# Patient Record
Sex: Female | Born: 1970 | Hispanic: No | Smoking: Never smoker
Health system: Southern US, Community
[De-identification: ages and names within clinical notes are randomized; demographics above are authoritative.]

## PROBLEM LIST (undated history)

## (undated) DIAGNOSIS — IMO0002 Reserved for concepts with insufficient information to code with codable children: Secondary | ICD-10-CM

## (undated) DIAGNOSIS — F419 Anxiety disorder, unspecified: Secondary | ICD-10-CM

## (undated) DIAGNOSIS — A64 Unspecified sexually transmitted disease: Secondary | ICD-10-CM

## (undated) DIAGNOSIS — K589 Irritable bowel syndrome without diarrhea: Secondary | ICD-10-CM

## (undated) HISTORY — DX: Reserved for concepts with insufficient information to code with codable children: IMO0002

## (undated) HISTORY — DX: Anxiety disorder, unspecified: F41.9

## (undated) HISTORY — PX: TONSILLECTOMY AND ADENOIDECTOMY: SUR1326

## (undated) HISTORY — DX: Irritable bowel syndrome, unspecified: K58.9

## (undated) HISTORY — DX: Unspecified sexually transmitted disease: A64

---
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
## 1988-06-16 DIAGNOSIS — A64 Unspecified sexually transmitted disease: Secondary | ICD-10-CM

## 1988-06-16 HISTORY — DX: Unspecified sexually transmitted disease: A64

## 2007-04-22 HISTORY — PX: INTRAUTERINE DEVICE (IUD) INSERTION: SHX5877

## 2007-08-15 HISTORY — PX: CERVICAL BIOPSY  W/ LOOP ELECTRODE EXCISION: SUR135

## 2008-05-26 ENCOUNTER — Other Ambulatory Visit: Admission: RE | Admit: 2008-05-26 | Discharge: 2008-05-26 | Payer: Self-pay | Admitting: Obstetrics and Gynecology

## 2008-12-08 DIAGNOSIS — Z800 Family history of malignant neoplasm of digestive organs: Secondary | ICD-10-CM | POA: Insufficient documentation

## 2009-07-30 HISTORY — PX: COLPOSCOPY: SHX161

## 2009-10-29 DIAGNOSIS — IMO0002 Reserved for concepts with insufficient information to code with codable children: Secondary | ICD-10-CM

## 2009-10-29 DIAGNOSIS — R87619 Unspecified abnormal cytological findings in specimens from cervix uteri: Secondary | ICD-10-CM

## 2009-10-29 HISTORY — DX: Unspecified abnormal cytological findings in specimens from cervix uteri: R87.619

## 2009-10-29 HISTORY — DX: Reserved for concepts with insufficient information to code with codable children: IMO0002

## 2010-01-28 ENCOUNTER — Ambulatory Visit (HOSPITAL_COMMUNITY): Admission: RE | Admit: 2010-01-28 | Discharge: 2010-01-28 | Payer: Self-pay | Admitting: Obstetrics and Gynecology

## 2010-01-28 ENCOUNTER — Encounter: Payer: Self-pay | Admitting: Obstetrics and Gynecology

## 2010-01-28 HISTORY — PX: VAGINAL HYSTERECTOMY: SUR661

## 2010-07-22 ENCOUNTER — Other Ambulatory Visit: Payer: Self-pay | Admitting: Obstetrics and Gynecology

## 2010-07-22 DIAGNOSIS — Z1231 Encounter for screening mammogram for malignant neoplasm of breast: Secondary | ICD-10-CM

## 2010-08-01 ENCOUNTER — Ambulatory Visit: Payer: Self-pay

## 2010-08-30 LAB — CBC
HCT: 34.8 % — ABNORMAL LOW (ref 36.0–46.0)
MCH: 30.4 pg (ref 26.0–34.0)
MCV: 88.5 fL (ref 78.0–100.0)
Platelets: 222 10*3/uL (ref 150–400)
RBC: 3.94 MIL/uL (ref 3.87–5.11)
RDW: 13.7 % (ref 11.5–15.5)

## 2012-12-29 ENCOUNTER — Encounter: Payer: Self-pay | Admitting: Obstetrics and Gynecology

## 2012-12-31 ENCOUNTER — Ambulatory Visit (INDEPENDENT_AMBULATORY_CARE_PROVIDER_SITE_OTHER): Payer: BC Managed Care – PPO | Admitting: Obstetrics and Gynecology

## 2012-12-31 ENCOUNTER — Encounter: Payer: Self-pay | Admitting: Obstetrics and Gynecology

## 2012-12-31 VITALS — BP 98/60 | HR 80 | Resp 20 | Ht 68.0 in | Wt 185.0 lb

## 2012-12-31 DIAGNOSIS — Z23 Encounter for immunization: Secondary | ICD-10-CM

## 2012-12-31 DIAGNOSIS — Z01419 Encounter for gynecological examination (general) (routine) without abnormal findings: Secondary | ICD-10-CM

## 2012-12-31 MED ORDER — SERTRALINE HCL 50 MG PO TABS: 100.0000 mg | ORAL_TABLET | Freq: Every day | ORAL | Status: DC
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
## 2012-12-31 NOTE — Patient Instructions (Signed)
EXERCISE AND DIET:  We recommended that you start or continue a regular exercise program for good health. Regular exercise means any activity that makes your heart beat faster and makes you sweat.  We recommend exercising at least 30 minutes per day at least 3 days a week, preferably 4 or 5.  We also recommend a diet low in fat and sugar.  Inactivity, poor dietary choices and obesity can cause diabetes, heart attack, stroke, and kidney damage, among others.    ALCOHOL AND SMOKING:  Women should limit their alcohol intake to no more than 7 drinks/beers/glasses of wine (combined, not each!) per week. Moderation of alcohol intake to this level decreases your risk of breast cancer and liver damage. And of course, no recreational drugs are part of a healthy lifestyle.  And absolutely no smoking or even second hand smoke. Most people know smoking can cause heart and lung diseases, but did you know it also contributes to weakening of your bones? Aging of your skin?  Yellowing of your teeth and nails?  CALCIUM AND VITAMIN D:  Adequate intake of calcium and Vitamin D are recommended.  The recommendations for exact amounts of these supplements seem to change often, but generally speaking 600 mg of calcium (either carbonate or citrate) and 800 units of Vitamin D per day seems prudent. Certain women may benefit from higher intake of Vitamin D.  If you are among these women, your doctor will have told you during your visit.    PAP SMEARS:  Pap smears, to check for cervical cancer or precancers,  have traditionally been done yearly, although recent scientific advances have shown that most women can have pap smears less often.  However, every woman still should have a physical exam from her gynecologist every year. It will include a breast check, inspection of the vulva and vagina to check for abnormal growths or skin changes, a visual exam of the cervix, and then an exam to evaluate the size and shape of the uterus and  ovaries.  And after 44 years of age, a rectal exam is indicated to check for rectal cancers. We will also provide age appropriate advice regarding health maintenance, like when you should have certain vaccines, screening for sexually transmitted diseases, bone density testing, colonoscopy, mammograms, etc.   MAMMOGRAMS:  All women over 40 years old should have a yearly mammogram. Many facilities now offer a "3D" mammogram, which may cost around $50 extra out of pocket. If possible,  we recommend you accept the option to have the 3D mammogram performed.  It both reduces the number of women who will be called back for extra views which then turn out to be normal, and it is better than the routine mammogram at detecting truly abnormal areas.       

## 2013-01-05 LAB — IPS PAP TEST WITH HPV

## 2013-01-27 ENCOUNTER — Other Ambulatory Visit: Payer: Self-pay | Admitting: Obstetrics and Gynecology

## 2013-01-27 NOTE — Telephone Encounter (Signed)
Refills x 1 year sent on 12/31/12.  RX denied.

## 2013-01-28 ENCOUNTER — Other Ambulatory Visit: Payer: Self-pay | Admitting: *Deleted

## 2013-01-28 MED ORDER — SERTRALINE HCL 100 MG PO TABS: 100.0000 mg | ORAL_TABLET | Freq: Every day | ORAL | Status: DC

## 2013-01-28 NOTE — Telephone Encounter (Signed)
Pt previously given 50 mg tab to take 2 daily to equal 100 mg.  Pt and pharmacy requesting 100 mg tab to take 1 tab daily.  RX sent.

## 2013-02-18 ENCOUNTER — Telehealth: Payer: Self-pay | Admitting: Obstetrics and Gynecology

## 2013-02-18 NOTE — Telephone Encounter (Signed)
9/5 lmtcb//kn

## 2013-02-18 NOTE — Telephone Encounter (Signed)
Patient needs refill on Fioricet 40 mgs. ESGIC  [facility]  [place]

## 2013-03-02 NOTE — Telephone Encounter (Signed)
S/w patient she apologized for not calling back she gets rx from [facility] not Dr. Tresa Res.

## 2014-01-09 ENCOUNTER — Ambulatory Visit: Payer: BC Managed Care – PPO | Admitting: Nurse Practitioner

## 2014-01-30 ENCOUNTER — Other Ambulatory Visit: Payer: Self-pay | Admitting: *Deleted

## 2014-01-30 MED ORDER — SERTRALINE HCL 100 MG PO TABS: 100.0000 mg | ORAL_TABLET | Freq: Every day | ORAL | Status: DC

## 2014-01-30 NOTE — Telephone Encounter (Signed)
Fax From: [facility] for Zoloft 100 mg  Last Refilled: 01/28/13 #30/11 refills  Aex Scheduled: 03/10/14 with Ms. Patty  Okay to refill until AEX?

## 2014-01-30 NOTE — Telephone Encounter (Signed)
Faxed fax stating that rx has been sent electronically. 

## 2014-03-10 ENCOUNTER — Ambulatory Visit: Payer: BC Managed Care – PPO | Admitting: Nurse Practitioner

## 2014-04-14 ENCOUNTER — Ambulatory Visit (INDEPENDENT_AMBULATORY_CARE_PROVIDER_SITE_OTHER): Payer: BC Managed Care – PPO | Admitting: Nurse Practitioner

## 2014-04-14 ENCOUNTER — Encounter: Payer: Self-pay | Admitting: Nurse Practitioner

## 2014-04-14 VITALS — BP 110/72 | HR 84 | Ht 68.0 in | Wt 191.0 lb

## 2014-04-14 DIAGNOSIS — Z1211 Encounter for screening for malignant neoplasm of colon: Secondary | ICD-10-CM

## 2014-04-14 DIAGNOSIS — Z0000 Encounter for general adult medical examination without abnormal findings: Secondary | ICD-10-CM

## 2014-04-14 DIAGNOSIS — Z01419 Encounter for gynecological examination (general) (routine) without abnormal findings: Secondary | ICD-10-CM

## 2014-04-14 DIAGNOSIS — R319 Hematuria, unspecified: Secondary | ICD-10-CM

## 2014-04-14 LAB — POCT URINALYSIS DIPSTICK
BILIRUBIN UA: NEGATIVE
Glucose, UA: NEGATIVE
KETONES UA: NEGATIVE
Leukocytes, UA: NEGATIVE
NITRITE UA: NEGATIVE
PH UA: 5
Protein, UA: NEGATIVE
Urobilinogen, UA: NEGATIVE

## 2014-04-14 LAB — HEMOGLOBIN, FINGERSTICK: HEMOGLOBIN, FINGERSTICK: 11.7 g/dL — AB (ref 12.0–16.0)

## 2014-04-14 MED ORDER — SERTRALINE HCL 100 MG PO TABS: 100.0000 mg | ORAL_TABLET | Freq: Every day | ORAL | Status: DC
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
## 2014-04-14 NOTE — Patient Instructions (Signed)

## 2014-04-15 LAB — URINALYSIS, MICROSCOPIC ONLY
Bacteria, UA: NONE SEEN
Casts: NONE SEEN
Crystals: NONE SEEN
Squamous Epithelial / HPF: NONE SEEN

## 2014-04-16 LAB — URINE CULTURE
Colony Count: NO GROWTH
ORGANISM ID, BACTERIA: NO GROWTH

## 2014-04-16 NOTE — Progress Notes (Signed)
Encounter reviewed by Dr. Conley SimmondsBrook Silva. Urine micro and culture sent due to microscopic hematuria.

## 2014-04-17 ENCOUNTER — Encounter: Payer: Self-pay | Admitting: Nurse Practitioner

## 2014-04-19 ENCOUNTER — Telehealth: Payer: Self-pay | Admitting: *Deleted

## 2014-04-19 LAB — IPS PAP TEST WITH HPV

## 2014-04-19 NOTE — Telephone Encounter (Signed)
Called pt to ask abut IFOB kit to see if Ms Chong Sicilian gave her one.  Patient called back and say that Ms Chong Sicilian did give her one.   - Tracy Weber doing IFOB Charges.

## 2014-06-14 LAB — FECAL OCCULT BLOOD, IMMUNOCHEMICAL: Fecal Occult Blood: NEGATIVE

## 2014-09-28 ENCOUNTER — Telehealth: Payer: Self-pay | Admitting: Nurse Practitioner

## 2014-09-28 NOTE — Telephone Encounter (Signed)
Left patient a message to call back to reschedule a future appointment that was cancelled by the provider. °

## 2014-11-07 DIAGNOSIS — D539 Nutritional anemia, unspecified: Secondary | ICD-10-CM | POA: Insufficient documentation

## 2014-11-08 DIAGNOSIS — E782 Mixed hyperlipidemia: Secondary | ICD-10-CM | POA: Insufficient documentation

## 2014-12-13 DIAGNOSIS — K222 Esophageal obstruction: Secondary | ICD-10-CM | POA: Insufficient documentation

## 2015-04-19 ENCOUNTER — Ambulatory Visit: Payer: Self-pay | Admitting: Nurse Practitioner

## 2015-05-08 ENCOUNTER — Other Ambulatory Visit: Payer: Self-pay | Admitting: Nurse Practitioner

## 2015-05-08 NOTE — Telephone Encounter (Signed)
Needs AEX 

## 2015-05-08 NOTE — Telephone Encounter (Signed)
Medication refill request: Zoloft 100 mg Last AEX: 04/14/2014 PG Next AEX: None Last MMG (if hormonal medication request): 10/25/2012 BiRADS 2 Refill authorized: Zoloft #90 tabs 3 Refills Today: #90 tabs 3 Refills ?

## 2015-08-17 ENCOUNTER — Other Ambulatory Visit: Payer: Self-pay | Admitting: Nurse Practitioner

## 2015-08-17 NOTE — Telephone Encounter (Signed)
Medication refill request: zoloft  Last AEX:  04/14/14  Next AEX: none Last MMG (if hormonal medication request): 10/2012 BIRADS2:Benign  Refill authorized: needs AEX . Per Mrs Patty's last refill note

## 2015-11-27 ENCOUNTER — Encounter: Payer: Self-pay | Admitting: Nurse Practitioner

## 2015-11-27 ENCOUNTER — Ambulatory Visit (INDEPENDENT_AMBULATORY_CARE_PROVIDER_SITE_OTHER): Payer: BLUE CROSS/BLUE SHIELD | Admitting: Nurse Practitioner

## 2015-11-27 VITALS — BP 100/66 | HR 68 | Resp 18 | Ht 68.25 in | Wt 192.0 lb

## 2015-11-27 DIAGNOSIS — Z0000 Encounter for general adult medical examination without abnormal findings: Secondary | ICD-10-CM

## 2015-11-27 DIAGNOSIS — Z01419 Encounter for gynecological examination (general) (routine) without abnormal findings: Secondary | ICD-10-CM | POA: Diagnosis not present

## 2015-11-27 LAB — HIV ANTIBODY (ROUTINE TESTING W REFLEX): HIV: NONREACTIVE
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
## 2015-11-27 NOTE — Patient Instructions (Signed)

## 2015-11-29 LAB — IPS PAP TEST WITH HPV

## 2015-11-30 NOTE — Progress Notes (Signed)
Encounter reviewed by Dr. Ardyth Kelso Amundson C. Silva.  

## 2015-12-10 ENCOUNTER — Other Ambulatory Visit: Payer: Self-pay | Admitting: Nurse Practitioner

## 2015-12-10 MED ORDER — SERTRALINE HCL 100 MG PO TABS: ORAL_TABLET | ORAL | Status: DC

## 2015-12-10 NOTE — Telephone Encounter (Signed)
Medication refill request: Sertraline 100mg  Last AEX:  11/27/15 PG Next AEX: 10/28/16 Last MMG (if hormonal medication request): 08/02/15 BIRADS2 Refill authorized: Sertraline 100 mg #30 0R. Please advise. Thank you.

## 2015-12-10 NOTE — Telephone Encounter (Signed)
Patient is asking for a refill of sertraline (ZOLOFT) 100 MG tablet. Confirmed pharmacy with patient.

## 2016-11-15 DIAGNOSIS — E7800 Pure hypercholesterolemia, unspecified: Secondary | ICD-10-CM | POA: Insufficient documentation

## 2016-11-28 ENCOUNTER — Ambulatory Visit: Payer: BLUE CROSS/BLUE SHIELD | Admitting: Nurse Practitioner

## 2016-12-08 ENCOUNTER — Other Ambulatory Visit: Payer: Self-pay | Admitting: Nurse Practitioner

## 2016-12-08 NOTE — Telephone Encounter (Signed)
Medication refill request: Zoloft  Last AEX:  11-27-15  Next AEX: 12-30-16  Last MMG (if hormonal medication request): 08-02-15 WNL  Refill authorized: please advise

## 2016-12-15 ENCOUNTER — Telehealth: Payer: Self-pay | Admitting: Nurse Practitioner

## 2016-12-15 NOTE — Telephone Encounter (Signed)
Call to patient about cx appt/ Patient is in aex recall/RD °

## 2016-12-30 ENCOUNTER — Ambulatory Visit: Payer: BLUE CROSS/BLUE SHIELD | Admitting: Nurse Practitioner

## 2017-01-08 ENCOUNTER — Encounter: Payer: Self-pay | Admitting: Certified Nurse Midwife

## 2017-01-08 ENCOUNTER — Other Ambulatory Visit (HOSPITAL_COMMUNITY)
Admission: RE | Admit: 2017-01-08 | Discharge: 2017-01-08 | Disposition: A | Discharge status: Home / Self Care | Payer: BLUE CROSS/BLUE SHIELD | Source: Ambulatory Visit | Attending: Obstetrics & Gynecology | Admitting: Obstetrics & Gynecology

## 2017-01-08 ENCOUNTER — Ambulatory Visit (INDEPENDENT_AMBULATORY_CARE_PROVIDER_SITE_OTHER): Payer: BLUE CROSS/BLUE SHIELD | Admitting: Certified Nurse Midwife

## 2017-01-08 VITALS — BP 110/62 | HR 64 | Resp 16 | Ht 68.5 in | Wt 185.0 lb

## 2017-01-08 DIAGNOSIS — F329 Major depressive disorder, single episode, unspecified: Secondary | ICD-10-CM

## 2017-01-08 DIAGNOSIS — Z01419 Encounter for gynecological examination (general) (routine) without abnormal findings: Secondary | ICD-10-CM | POA: Insufficient documentation

## 2017-01-08 DIAGNOSIS — F419 Anxiety disorder, unspecified: Secondary | ICD-10-CM | POA: Diagnosis not present

## 2017-01-08 DIAGNOSIS — Z124 Encounter for screening for malignant neoplasm of cervix: Secondary | ICD-10-CM | POA: Diagnosis not present

## 2017-01-08 DIAGNOSIS — F32A Depression, unspecified: Secondary | ICD-10-CM

## 2017-01-08 MED ORDER — SERTRALINE HCL 100 MG PO TABS: 100.0000 mg | ORAL_TABLET | Freq: Every day | ORAL | 3 refills | Status: DC
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
## 2017-01-08 NOTE — Patient Instructions (Signed)

## 2017-01-08 NOTE — Addendum Note (Signed)
Addended by: Verner CholLEONARD, Earlie Schank S on: 01/08/2017 03:35 PM   Modules accepted: Orders

## 2017-01-12 LAB — CYTOLOGY - PAP: Diagnosis: NEGATIVE

## 2018-01-13 ENCOUNTER — Other Ambulatory Visit (HOSPITAL_COMMUNITY)
Admission: RE | Admit: 2018-01-13 | Discharge: 2018-01-13 | Disposition: A | Discharge status: Home / Self Care | Payer: BLUE CROSS/BLUE SHIELD | Source: Ambulatory Visit | Attending: Obstetrics & Gynecology | Admitting: Obstetrics & Gynecology

## 2018-01-13 ENCOUNTER — Encounter: Payer: Self-pay | Admitting: Certified Nurse Midwife

## 2018-01-13 ENCOUNTER — Ambulatory Visit: Payer: BLUE CROSS/BLUE SHIELD | Admitting: Certified Nurse Midwife

## 2018-01-13 VITALS — BP 110/78 | HR 68 | Resp 16 | Ht 67.75 in | Wt 178.0 lb

## 2018-01-13 DIAGNOSIS — Z8742 Personal history of other diseases of the female genital tract: Secondary | ICD-10-CM

## 2018-01-13 DIAGNOSIS — Z124 Encounter for screening for malignant neoplasm of cervix: Secondary | ICD-10-CM | POA: Diagnosis not present

## 2018-01-13 DIAGNOSIS — Z01419 Encounter for gynecological examination (general) (routine) without abnormal findings: Secondary | ICD-10-CM | POA: Diagnosis not present

## 2018-01-13 DIAGNOSIS — Z87898 Personal history of other specified conditions: Secondary | ICD-10-CM | POA: Diagnosis not present

## 2018-01-13 NOTE — Patient Instructions (Signed)
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
## 2018-01-15 LAB — CYTOLOGY - PAP: Diagnosis: NEGATIVE

## 2018-03-20 ENCOUNTER — Other Ambulatory Visit: Payer: Self-pay | Admitting: Certified Nurse Midwife

## 2018-03-20 DIAGNOSIS — F419 Anxiety disorder, unspecified: Principal | ICD-10-CM

## 2018-03-20 DIAGNOSIS — F329 Major depressive disorder, single episode, unspecified: Secondary | ICD-10-CM

## 2018-03-22 NOTE — Telephone Encounter (Signed)
Medication refill request: Zoloft  Last AEX:  01-13-18 DL  Next AEX: 1-61- 20  Last MMG (if hormonal medication request): 11-12-17 BIRADS 2 benign  Refill authorized: 01-08-17 #90, 3RF. Today, please advise.

## 2019-01-07 ENCOUNTER — Other Ambulatory Visit: Payer: Self-pay

## 2019-01-11 ENCOUNTER — Ambulatory Visit: Payer: BC Managed Care – PPO | Admitting: Certified Nurse Midwife

## 2019-01-11 ENCOUNTER — Other Ambulatory Visit (HOSPITAL_COMMUNITY)
Admission: RE | Admit: 2019-01-11 | Discharge: 2019-01-11 | Disposition: A | Discharge status: Home / Self Care | Payer: BC Managed Care – PPO | Source: Ambulatory Visit | Attending: Certified Nurse Midwife | Admitting: Certified Nurse Midwife

## 2019-01-11 ENCOUNTER — Other Ambulatory Visit: Payer: Self-pay

## 2019-01-11 ENCOUNTER — Encounter: Payer: Self-pay | Admitting: Certified Nurse Midwife

## 2019-01-11 VITALS — BP 110/64 | HR 68 | Temp 97.2°F | Resp 16 | Ht 67.75 in | Wt 168.0 lb

## 2019-01-11 DIAGNOSIS — Z01419 Encounter for gynecological examination (general) (routine) without abnormal findings: Secondary | ICD-10-CM | POA: Diagnosis not present

## 2019-01-11 DIAGNOSIS — Z124 Encounter for screening for malignant neoplasm of cervix: Secondary | ICD-10-CM | POA: Insufficient documentation
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
## 2019-01-13 LAB — CYTOLOGY - PAP
Diagnosis: NEGATIVE
HPV: NOT DETECTED

## 2019-03-26 ENCOUNTER — Other Ambulatory Visit: Payer: Self-pay | Admitting: Certified Nurse Midwife

## 2019-03-26 DIAGNOSIS — F419 Anxiety disorder, unspecified: Secondary | ICD-10-CM

## 2019-03-26 DIAGNOSIS — F329 Major depressive disorder, single episode, unspecified: Secondary | ICD-10-CM

## 2019-03-28 NOTE — Telephone Encounter (Signed)
Medication refill request: Zoloft Last AEX:  01/11/2019 DL Next AEX: 01/12/3030 Last MMG (if hormonal medication request): 11/12/2017 BIRADS 2 Negative Refill authorized: Pending #90 with 2 refills if appropriate. Please advise.

## 2019-09-05 ENCOUNTER — Encounter: Payer: Self-pay | Admitting: Certified Nurse Midwife

## 2020-01-13 ENCOUNTER — Ambulatory Visit: Payer: BC Managed Care – PPO | Admitting: Certified Nurse Midwife
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
## 2020-02-23 ENCOUNTER — Ambulatory Visit: Payer: BC Managed Care – PPO | Admitting: Obstetrics and Gynecology

## 2020-03-12 ENCOUNTER — Ambulatory Visit: Payer: BC Managed Care – PPO | Admitting: Obstetrics and Gynecology

## 2020-03-29 ENCOUNTER — Telehealth: Payer: Self-pay

## 2020-03-29 DIAGNOSIS — F32A Depression, unspecified: Secondary | ICD-10-CM

## 2020-03-29 DIAGNOSIS — F419 Anxiety disorder, unspecified: Secondary | ICD-10-CM

## 2020-03-29 MED ORDER — SERTRALINE HCL 100 MG PO TABS: 100.0000 mg | ORAL_TABLET | Freq: Every day | ORAL | 0 refills | Status: DC

## 2020-03-29 NOTE — Telephone Encounter (Signed)
Refill sent. She is over due for an annual exam, see if you can get her in with Rutherford Guys, NP sooner (if patient is okay with that plan)

## 2020-03-29 NOTE — Telephone Encounter (Signed)
Medication refill request: Sertraline 100mg   Last AEX:  01/11/19 Next AEX: 09/06/20 Last MMG (if hormonal medication request): 11/12/17  Neg  Refill authorized: 90/0

## 2020-04-12 NOTE — Telephone Encounter (Signed)
Left message for pt to return call to triage RN. Next AEX 08/2020 Last AEX 12/2018 with DL

## 2020-04-12 NOTE — Telephone Encounter (Signed)
Patient returned call. She is scheduled for aex 04/30/20 with Tresa Endo.
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
## 2020-04-30 ENCOUNTER — Other Ambulatory Visit: Payer: Self-pay

## 2020-04-30 ENCOUNTER — Ambulatory Visit: Payer: BC Managed Care – PPO | Admitting: Nurse Practitioner

## 2020-04-30 ENCOUNTER — Encounter: Payer: Self-pay | Admitting: Nurse Practitioner

## 2020-04-30 VITALS — BP 104/74 | HR 68 | Resp 16 | Ht 67.75 in | Wt 177.0 lb

## 2020-04-30 DIAGNOSIS — K644 Residual hemorrhoidal skin tags: Secondary | ICD-10-CM | POA: Diagnosis not present

## 2020-04-30 DIAGNOSIS — Z01419 Encounter for gynecological examination (general) (routine) without abnormal findings: Secondary | ICD-10-CM | POA: Diagnosis not present

## 2020-04-30 NOTE — Patient Instructions (Signed)
Nice to meet you today!

## 2020-06-11 ENCOUNTER — Telehealth: Payer: Self-pay

## 2020-06-11 DIAGNOSIS — K644 Residual hemorrhoidal skin tags: Secondary | ICD-10-CM

## 2020-06-11 NOTE — Telephone Encounter (Signed)
Spoke with patient. Patient request to proceed with removal of skin lesion discussed at AEX on 04/30/20.  Procedure scheduled for 12/29 at 1:30pm with Clarita Crane, NP.  Patient verbalizes understanding and is agreeable.   Tresa Endo -please confirm size of lesion for precert.   Cc: Hayley Carder

## 2020-06-11 NOTE — Telephone Encounter (Signed)
Patient is calling in regards to needing a skin tag removal.

## 2020-06-11 NOTE — Telephone Encounter (Signed)
Reviewed with Tracy Crane, NP.  Skin lesion approximately 33mm in size.  Order placed for precert.   Routing to [employer].  Encounter Closed.

## 2020-06-12 ENCOUNTER — Telehealth: Payer: Self-pay

## 2020-06-12 NOTE — Telephone Encounter (Signed)
Call to patient. Per DPR, OK to leave message on voicemail.   Left voicemail requesting a return call to [facility] to review benefits for scheduled Skin lesion removal with Clarita Crane, NP.
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
## 2020-06-13 ENCOUNTER — Other Ambulatory Visit: Payer: Self-pay

## 2020-06-13 ENCOUNTER — Encounter: Payer: Self-pay | Admitting: Nurse Practitioner

## 2020-06-13 ENCOUNTER — Other Ambulatory Visit (HOSPITAL_COMMUNITY)
Admission: RE | Admit: 2020-06-13 | Discharge: 2020-06-13 | Disposition: A | Discharge status: Home / Self Care | Payer: BC Managed Care – PPO | Source: Ambulatory Visit | Attending: Nurse Practitioner | Admitting: Nurse Practitioner

## 2020-06-13 ENCOUNTER — Ambulatory Visit (INDEPENDENT_AMBULATORY_CARE_PROVIDER_SITE_OTHER): Payer: BC Managed Care – PPO | Admitting: Nurse Practitioner

## 2020-06-13 VITALS — BP 114/64 | HR 68 | Wt 182.0 lb

## 2020-06-13 DIAGNOSIS — L989 Disorder of the skin and subcutaneous tissue, unspecified: Secondary | ICD-10-CM | POA: Insufficient documentation

## 2020-06-13 DIAGNOSIS — K644 Residual hemorrhoidal skin tags: Secondary | ICD-10-CM | POA: Diagnosis not present

## 2020-06-13 NOTE — Addendum Note (Signed)
Addended by: Timoteo Expose on: 06/13/2020 05:04 PM   Modules accepted: Orders

## 2020-06-13 NOTE — Patient Instructions (Signed)
Keep clean and dry . Remove bandage after 24 hours, keep covered with band-aid until well scabbed. Please contact office if any signs of infection (redness, pain, drainage, odor) If bleeding that saturates the gauze occurs, apply pressure, add additional gauze over existing dressing and call clinic. Your biopsy results should be back in 1 week.   Excision of Skin Lesions, Care After This sheet gives you information about how to care for yourself after your procedure. Your health care provider may also give you more specific instructions. If you have problems or questions, contact your health care provider. What can I expect after the procedure? After your procedure, it is common to have pain or discomfort at the excision site. Follow these instructions at home: Excision care   Follow instructions from your health care provider about how to take care of your excision site. Make sure you: ? Wash your hands with soap and water before and after you change your bandage (dressing). If soap and water are not available, use hand sanitizer. ? Change your dressing as told by your health care provider. ? Leave stitches (sutures), skin glue, or adhesive strips in place. These skin closures may need to stay in place for 2 weeks or longer. If adhesive strip edges start to loosen and curl up, you may trim the loose edges. Do not remove adhesive strips completely unless your health care provider tells you to do that.  Check the excision area every day for signs of infection. Watch for: ? Redness, swelling, or pain. ? Fluid or blood. ? Warmth. ? Pus or a bad smell.  Keep the site clean, dry, and protected for at least 48 hours.  For bleeding, apply gentle but firm pressure to the area using a folded towel for 20 minutes.  Avoid high-impact exercise and activities until the sutures are removed or the area heals. General instructions  Take over-the-counter and prescription medicines only as told by your  health care provider.  Follow instructions from your health care provider about how to minimize scarring. Scarring should lessen over time.  Avoid sun exposure until the area has healed. Use sunscreen to protect the area from the sun after it has healed.  Keep all follow-up visits as told by your health care provider. This is important. Contact a health care provider if:  You have redness, swelling, or pain around your excision site.  You have fluid or blood coming from your excision site.  Your excision site feels warm to the touch.  You have pus or a bad smell coming from your excision site.  You have a fever.  You have pain that does not improve in 2-3 days after your procedure.  You notice skin irregularities or changes in how you feel (sensation). Summary  This sheet of instructions provides you with information about caring for yourself after your procedure. Contact your health care provider if you have any problems or questions.  Take over-the-counter and prescription medicines only as told by your health care provider.  Change your dressing as told by your health care provider.  Contact a health care provider if you have redness, swelling, pain, or other signs of infection around your excision site.  Keep all follow-up visits as told by your health care provider. This is important. This information is not intended to replace advice given to you by your health care provider. Make sure you discuss any questions you have with your health care provider. Document Revised: 12/09/2017 Document Reviewed: 12/09/2017 Elsevier Patient  Education © 2020 [employer]vier Inc. ° °

## 2020-06-14 NOTE — Addendum Note (Signed)
Addended by: Eliezer Bottom on: 06/14/2020 07:51 AM   Modules accepted: Orders

## 2020-06-18 LAB — SURGICAL PATHOLOGY

## 2020-06-23 ENCOUNTER — Other Ambulatory Visit: Payer: Self-pay | Admitting: Obstetrics and Gynecology

## 2020-06-23 DIAGNOSIS — F32A Depression, unspecified: Secondary | ICD-10-CM

## 2020-06-23 DIAGNOSIS — F419 Anxiety disorder, unspecified: Secondary | ICD-10-CM

## 2020-06-27 ENCOUNTER — Encounter: Payer: Self-pay | Admitting: Nurse Practitioner

## 2020-06-27 NOTE — Telephone Encounter (Signed)
Medication refill request: Zoloft  Last AEX:  04-30-20 KD Next AEX: 05-02-21 Last MMG (if hormonal medication request): n/a Refill authorized: Today, please advise.   Medication pended for #90, 2RF. Please refill if appropriate.

## 2020-09-06 ENCOUNTER — Ambulatory Visit: Payer: BC Managed Care – PPO | Admitting: Obstetrics and Gynecology

## 2020-09-24 ENCOUNTER — Other Ambulatory Visit: Payer: Self-pay | Admitting: Nurse Practitioner

## 2020-09-24 DIAGNOSIS — Z1231 Encounter for screening mammogram for malignant neoplasm of breast: Secondary | ICD-10-CM

## 2020-09-27 ENCOUNTER — Other Ambulatory Visit: Payer: Self-pay

## 2020-09-27 ENCOUNTER — Ambulatory Visit
Admission: RE | Admit: 2020-09-27 | Discharge: 2020-09-27 | Disposition: A | Discharge status: Home / Self Care | Payer: BC Managed Care – PPO | Source: Ambulatory Visit

## 2020-09-27 DIAGNOSIS — Z1231 Encounter for screening mammogram for malignant neoplasm of breast: Secondary | ICD-10-CM

## 2021-04-23 ENCOUNTER — Other Ambulatory Visit: Payer: Self-pay | Admitting: *Deleted

## 2021-04-23 DIAGNOSIS — F32A Depression, unspecified: Secondary | ICD-10-CM

## 2021-04-23 DIAGNOSIS — F419 Anxiety disorder, unspecified: Secondary | ICD-10-CM

## 2021-04-23 MED ORDER — SERTRALINE HCL 100 MG PO TABS: 100.0000 mg | ORAL_TABLET | Freq: Every day | ORAL | 0 refills | Status: DC

## 2021-04-23 NOTE — Telephone Encounter (Signed)
Patient informed. 

## 2021-04-23 NOTE — Telephone Encounter (Signed)
Patient has annual exam scheduled on 06/19/2021, former Tracy Crane, NP patient needs refill on Zoloft 100 mg tablet. Okay to send?

## 2021-05-02 ENCOUNTER — Ambulatory Visit: Payer: BC Managed Care – PPO | Admitting: Nurse Practitioner

## 2021-06-19 ENCOUNTER — Ambulatory Visit: Payer: BC Managed Care – PPO | Admitting: Nurse Practitioner

## 2021-07-11 ENCOUNTER — Ambulatory Visit: Payer: BC Managed Care – PPO | Admitting: Nurse Practitioner

## 2021-07-31 ENCOUNTER — Other Ambulatory Visit: Payer: Self-pay

## 2021-07-31 ENCOUNTER — Encounter: Payer: Self-pay | Admitting: Nurse Practitioner

## 2021-07-31 ENCOUNTER — Ambulatory Visit (INDEPENDENT_AMBULATORY_CARE_PROVIDER_SITE_OTHER): Payer: BC Managed Care – PPO | Admitting: Nurse Practitioner

## 2021-07-31 VITALS — BP 114/74 | Ht 68.0 in | Wt 178.0 lb

## 2021-07-31 DIAGNOSIS — F32A Depression, unspecified: Secondary | ICD-10-CM | POA: Diagnosis not present

## 2021-07-31 DIAGNOSIS — F419 Anxiety disorder, unspecified: Secondary | ICD-10-CM | POA: Diagnosis not present

## 2021-07-31 DIAGNOSIS — Z01419 Encounter for gynecological examination (general) (routine) without abnormal findings: Secondary | ICD-10-CM

## 2021-07-31 MED ORDER — SERTRALINE HCL 100 MG PO TABS: 100.0000 mg | ORAL_TABLET | Freq: Every day | ORAL | 3 refills | Status: DC

## 2021-07-31 NOTE — Progress Notes (Signed)
° °  Tracy Weber 06/28/1970 [id number]   History:  51 y.o. G2P2002 presents for annual exam. S/P 2011 [facility] for CIN 3. Denies menopausal symptoms. Following keto diet for weight management. Anxiety and depression managed well on Zoloft.   Gynecologic History Patient's last menstrual period was 01/28/2010.   Contraception/Family planning: status post hysterectomy Sexually active: Yes  Health Maintenance Last Pap: 01/11/2019. Results were: Normal, 5-year repeat Last mammogram: 09/27/2020. Results were: Normal Last colonoscopy: 3-4 years ago. Results were: Normal per patient Last Dexa: Not indicated  Past medical history, past surgical history, family history and social history were all reviewed and documented in the EPIC chart. 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 59 yo daughter, 32 yo son.   ROS:  A ROS was performed and pertinent positives and negatives are included.  Exam:  Vitals:   07/31/21 0838  BP: 114/74  Weight: 178 lb (80.7 kg)  Height: 5\' 8"  (1.727 m)   Body mass index is 27.06 kg/m.  General appearance:  Normal Thyroid:  Symmetrical, normal in size, without palpable masses or nodularity. Respiratory  Auscultation:  Clear without wheezing or rhonchi Cardiovascular  Auscultation:  Regular rate, without rubs, murmurs or gallops  Edema/varicosities:  Not grossly evident Abdominal  Soft,nontender, without masses, guarding or rebound.  Liver/spleen:  No organomegaly noted  Hernia:  None appreciated  Skin  Inspection:  Grossly normal Breasts: Examined lying and sitting.   Right: Without masses, retractions, nipple discharge or axillary adenopathy.   Left: Without masses, retractions, nipple discharge or axillary adenopathy. Genitourinary   Inguinal/mons:  Normal without inguinal adenopathy  External genitalia:  Normal appearing  vulva with no masses, tenderness, or lesions  BUS/Urethra/Skene's glands:  Normal  Vagina:  Normal appearing with normal color and discharge, no lesions  Cervix:  Absent  Uterus:  Absent  Adnexa/parametria:     Rt: Normal in size, without masses or tenderness.   Lt: Normal in size, without masses or tenderness.  Anus and perineum: Normal  Digital rectal exam: Normal sphincter tone without palpated masses or tenderness  Patient informed chaperone available to be present for breast and pelvic exam. Patient has requested no chaperone to be present. Patient has been advised what will be completed during breast and pelvic exam.   Assessment/Plan:  51 y.o. G2P2002 for annual exam.   Well female exam with routine gynecological exam - Education provided on SBEs, importance of preventative screenings, current guidelines, high calcium diet, regular exercise, and multivitamin daily.  Labs with PCP.   Anxiety and depression - Plan: sertraline (ZOLOFT) 100 MG tablet daily. Doing well on this and wants to continue. Refill x 1 year provided.  Screening for cervical cancer - H/O CIN 3, hysterectomy 2011. Normal paps since. Will repeat at 5-year interval per guidelines.  Screening for breast cancer - Normal mammogram history.  Continue annual screenings.  Normal breast exam today.  Screening for colon cancer - Normal colonoscopy 3-4 years agp. Will repeat at 10-year interval per GI's recommendation.   Return in 1 year for annual.     2012 DNP, 8:54 AM 07/31/2021

## 2021-08-26 ENCOUNTER — Other Ambulatory Visit: Payer: Self-pay | Admitting: Nurse Practitioner

## 2021-08-26 DIAGNOSIS — Z1231 Encounter for screening mammogram for malignant neoplasm of breast: Secondary | ICD-10-CM

## 2021-10-28 ENCOUNTER — Ambulatory Visit
Admission: RE | Admit: 2021-10-28 | Discharge: 2021-10-28 | Disposition: A | Discharge status: Home / Self Care | Payer: BC Managed Care – PPO | Source: Ambulatory Visit | Attending: Nurse Practitioner | Admitting: Nurse Practitioner

## 2021-10-28 DIAGNOSIS — Z1231 Encounter for screening mammogram for malignant neoplasm of breast: Secondary | ICD-10-CM

## 2021-11-02 IMAGING — MG MM DIGITAL SCREENING BILAT W/ TOMO AND CAD
8 series · 8 of 24 positions shown · non-contrast
Comparison: Previous exam(s).

CLINICAL DATA: Screening.

EXAM:
DIGITAL SCREENING BILATERAL MAMMOGRAM WITH TOMOSYNTHESIS AND CAD
TECHNIQUE: Bilateral screening digital craniocaudal and mediolateral oblique
mammograms were obtained. Bilateral screening digital breast
tomosynthesis was performed. The images were evaluated with
computer-aided detection.

[L CC synth-2D]
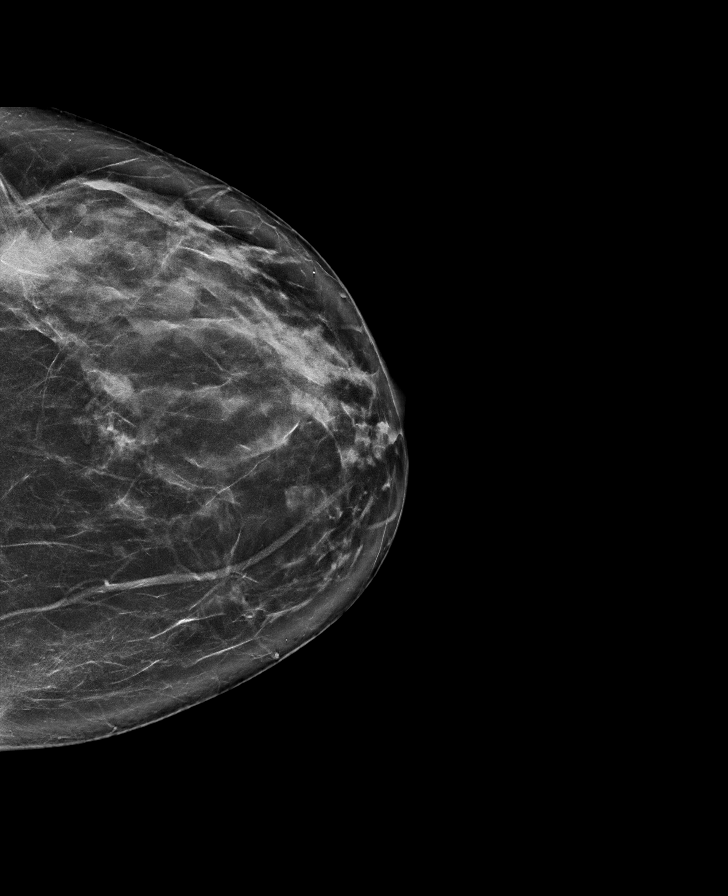

[R CC synth-2D]
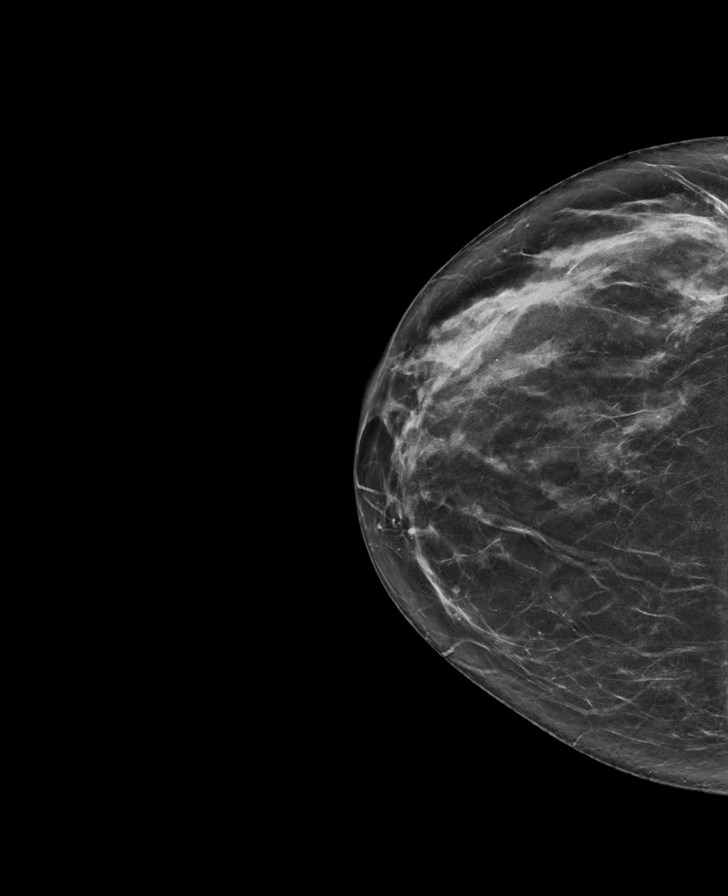

[R MLO synth-2D]
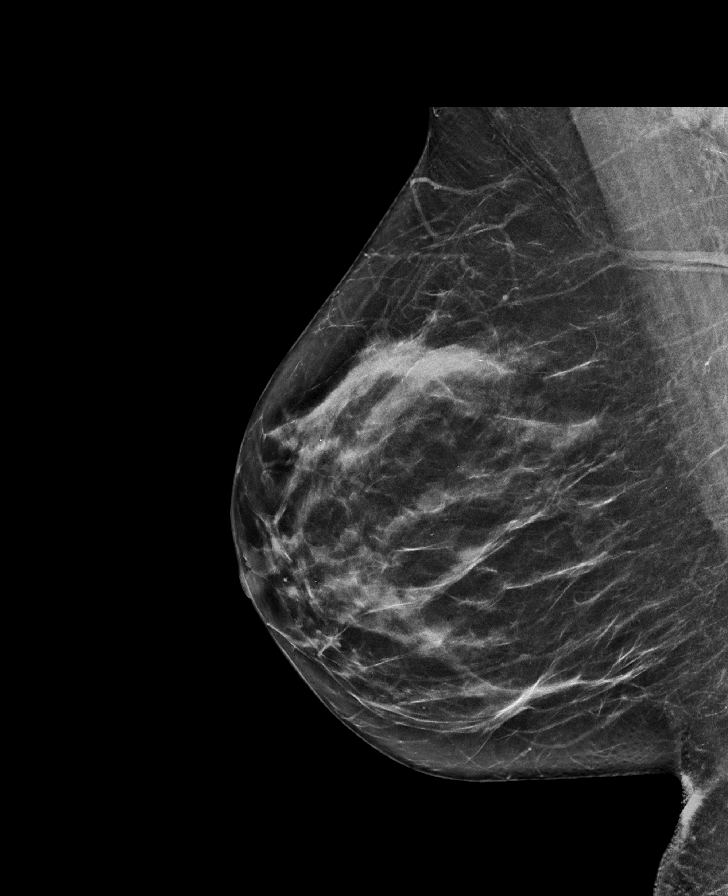

[L MLO synth-2D]
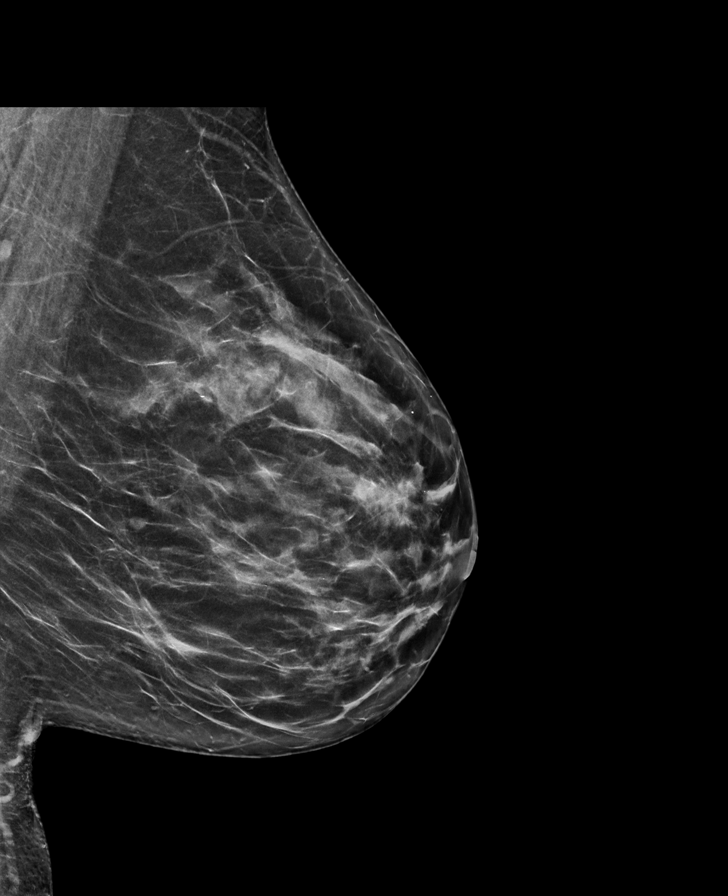

[R MLO tomo · tomo slice 37/73.0]
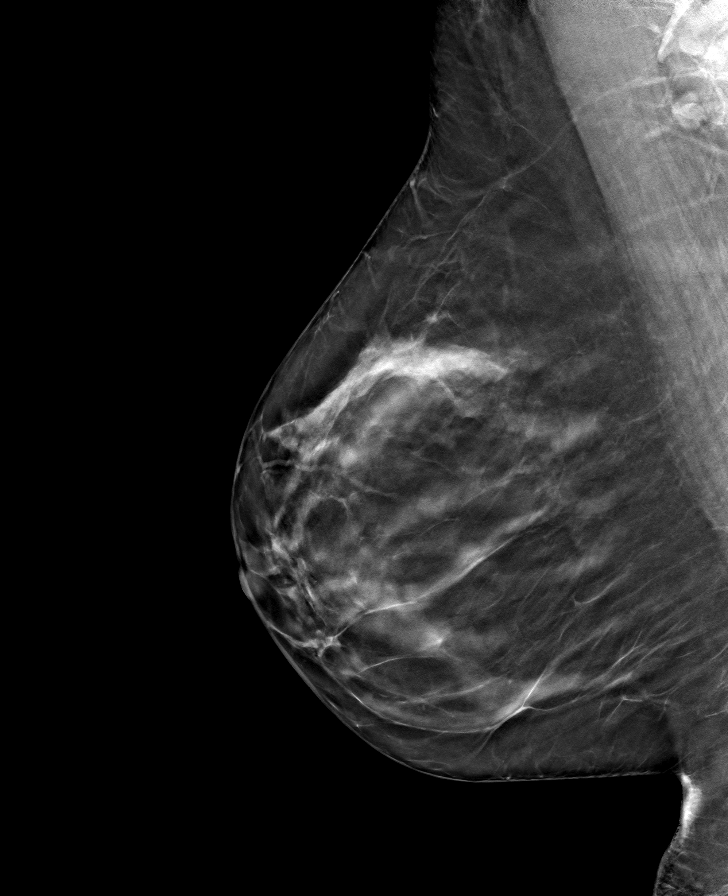

[L MLO tomo · tomo slice 37/73.0]
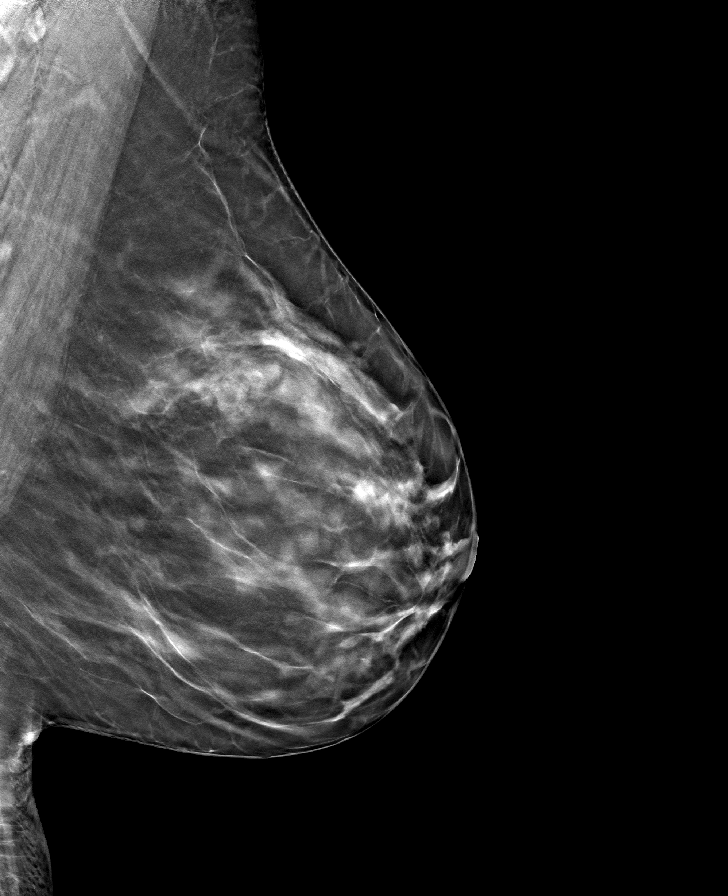

[L CC tomo · tomo slice 39/76.0]
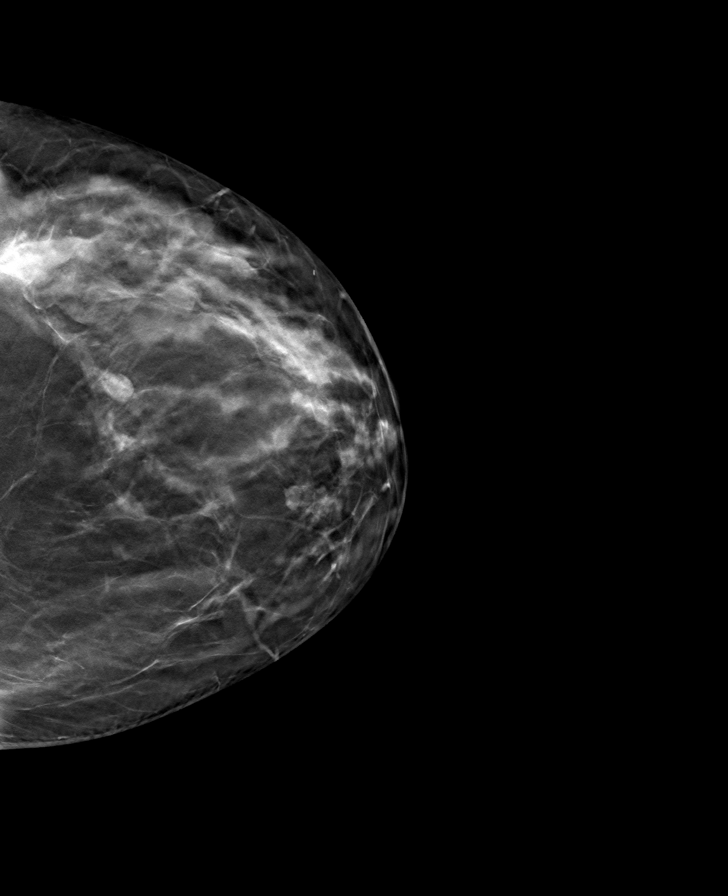

[R CC tomo · tomo slice 37/72.0]
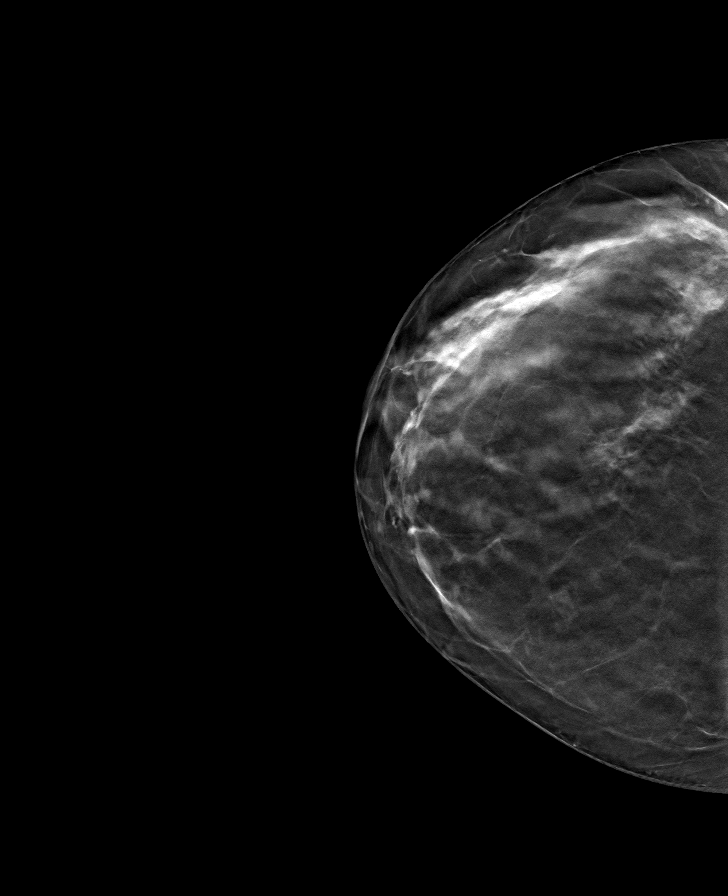

[8 of 24 positions shown; findings below may reference images not displayed]

ACR Breast Density Category c: The breast tissue is heterogeneously
dense, which may obscure small masses.
FINDINGS: There are no findings suspicious for malignancy. The images were
evaluated with computer-aided detection.
IMPRESSION: No mammographic evidence of malignancy. A result letter of this
screening mammogram will be mailed directly to the patient.

RECOMMENDATION:
Screening mammogram in one year. (Code:T4-5-GWO)

BI-RADS CATEGORY  1: Negative.

## 2022-04-10 ENCOUNTER — Other Ambulatory Visit: Payer: Self-pay | Admitting: Nurse Practitioner

## 2022-04-10 ENCOUNTER — Telehealth: Payer: Self-pay

## 2022-04-10 ENCOUNTER — Other Ambulatory Visit: Payer: Self-pay

## 2022-04-10 DIAGNOSIS — F419 Anxiety disorder, unspecified: Secondary | ICD-10-CM

## 2022-04-10 MED ORDER — SERTRALINE HCL 100 MG PO TABS: 100.0000 mg | ORAL_TABLET | Freq: Every day | ORAL | 1 refills | Status: DC

## 2022-04-10 NOTE — Telephone Encounter (Signed)
New prescription sent to updated pharmacy. Thanks.

## 2022-04-10 NOTE — Telephone Encounter (Signed)
Patient moved to [facility] and changed pharmacy. When she picked up her Sertaline the pill was different and the pharmacy said they used a different manufacturer than her previous Rx.  She took it and said after taking 2 pills she was experiencing intense itching all over her body. After two days she stopped taking it and the itching stopped. She said she suspects she must be allergic to something int the pill.  She found a pharmacy that carries the manufacturer brand she previously was taking and asked if you would resend the Rx to that pharmacy.  Pharmacy is set.  We discussed that likely her insurance will not pay for her to get it again this soon. I advised her she could call her ins co and speak with customer service and maybe able to obtain override to have them pay for new Rx. If not and she has to pay oop she is aware of [employer] and has a card and will check price there.  Last AEX 07/31/21.

## 2022-05-05 ENCOUNTER — Other Ambulatory Visit: Payer: Self-pay | Admitting: Nurse Practitioner

## 2022-05-05 ENCOUNTER — Telehealth: Payer: Self-pay | Admitting: *Deleted

## 2022-05-05 DIAGNOSIS — F419 Anxiety disorder, unspecified: Secondary | ICD-10-CM

## 2022-05-05 MED ORDER — SERTRALINE HCL 50 MG PO TABS: ORAL_TABLET | ORAL | 0 refills | Status: DC

## 2022-05-05 MED ORDER — SERTRALINE HCL 25 MG PO TABS: ORAL_TABLET | ORAL | 0 refills | Status: DC

## 2022-05-05 NOTE — Telephone Encounter (Signed)
Patient called takes Zoloft 100 mg tablet, called would like to wean off Rx. Patient asked how she would go about trying to do this? Please advise

## 2022-05-05 NOTE — Telephone Encounter (Signed)
Week 1: Decrease to 75 mg daily (50 mg + 25 mg tablets) Week 2: Decrease to 50 mg daily Week 3: Decrease to 25 mg daily then stop use  I will send in 50 and 25 mg tablets to get her through weaning process.

## 2022-05-05 NOTE — Telephone Encounter (Signed)
Spoke with patient, advised as seen below per Elmarie Shiley, NP. Patient verbalizes understanding and is agreeable.   Encounter closed.

## 2022-05-05 NOTE — Telephone Encounter (Signed)
Left message for patient to call.

## 2022-05-30 ENCOUNTER — Encounter: Payer: Self-pay | Admitting: Nurse Practitioner

## 2022-06-02 ENCOUNTER — Other Ambulatory Visit: Payer: Self-pay | Admitting: Nurse Practitioner

## 2022-06-02 DIAGNOSIS — F419 Anxiety disorder, unspecified: Secondary | ICD-10-CM

## 2022-06-02 MED ORDER — SERTRALINE HCL 25 MG PO TABS: 25.0000 mg | ORAL_TABLET | Freq: Every day | ORAL | 0 refills | Status: DC

## 2022-06-29 ENCOUNTER — Other Ambulatory Visit: Payer: Self-pay | Admitting: Nurse Practitioner

## 2022-06-29 DIAGNOSIS — F32A Depression, unspecified: Secondary | ICD-10-CM

## 2022-07-01 NOTE — Telephone Encounter (Signed)
Refill request received for sertraline. Per review of encounter dated 05/30/22, patient was trying to wean down/off.   Spoke with patient, would like to continue 25 mg dosage daily or q other day. Has enough of current Rx to last until AEX 08/04/22, will not send additional Rx at this time, will plan to further discuss with Tiffany.   Call transferred to front office to update insurance.   Routing to [employer].   Rx refused.   Encounter closed.

## 2022-08-01 ENCOUNTER — Encounter: Payer: Self-pay | Admitting: Nurse Practitioner

## 2022-08-04 ENCOUNTER — Ambulatory Visit (INDEPENDENT_AMBULATORY_CARE_PROVIDER_SITE_OTHER): Payer: PRIVATE HEALTH INSURANCE | Admitting: Nurse Practitioner

## 2022-08-04 ENCOUNTER — Encounter: Payer: Self-pay | Admitting: Nurse Practitioner

## 2022-08-04 ENCOUNTER — Other Ambulatory Visit (HOSPITAL_COMMUNITY)
Admission: RE | Admit: 2022-08-04 | Discharge: 2022-08-04 | Disposition: A | Discharge status: Home / Self Care | Payer: PRIVATE HEALTH INSURANCE | Source: Ambulatory Visit | Attending: Nurse Practitioner | Admitting: Nurse Practitioner

## 2022-08-04 VITALS — BP 102/72 | HR 91 | Ht 67.75 in | Wt 184.0 lb

## 2022-08-04 DIAGNOSIS — Z01419 Encounter for gynecological examination (general) (routine) without abnormal findings: Secondary | ICD-10-CM | POA: Insufficient documentation

## 2022-08-04 DIAGNOSIS — F32A Depression, unspecified: Secondary | ICD-10-CM

## 2022-08-04 DIAGNOSIS — F419 Anxiety disorder, unspecified: Secondary | ICD-10-CM | POA: Diagnosis not present

## 2022-08-04 MED ORDER — SERTRALINE HCL 25 MG PO TABS: 25.0000 mg | ORAL_TABLET | Freq: Every day | ORAL | 3 refills | Status: DC

## 2022-08-04 NOTE — Progress Notes (Signed)
   Tracy Weber [birth year] [id number]   History:  52 y.o. G2P2002 presents for annual exam. S/P 2011 [employer] for CIN-3. Denies menopausal symptoms. Anxiety and depression managed well on Zoloft. Tried to wean recently but restarted on lower dose of 25 mg.   Gynecologic History Patient's last menstrual period was 01/28/2010.   Contraception/Family planning: status post hysterectomy Sexually active: Yes  Health Maintenance Last Pap: 01/11/2019. Results were: Normal neg HPV Last mammogram: 10/28/2021. Results were: Normal Last colonoscopy: 3-4 years ago. Results were: Normal per patient Last Dexa: Not indicated  Past medical history, past surgical history, family history and social history were all reviewed and documented in the EPIC chart. 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 31 yo daughter, 57 yo son.   ROS:  A ROS was performed and pertinent positives and negatives are included.  Exam:  Vitals:   08/04/22 1605  BP: 102/72  Pulse: 91  SpO2: 98%  Weight: 184 lb (83.5 kg)  Height: 5' 7.75" (1.721 m)    Body mass index is 28.18 kg/m.  General appearance:  Normal Thyroid:  Symmetrical, normal in size, without palpable masses or nodularity. Respiratory  Auscultation:  Clear without wheezing or rhonchi Cardiovascular  Auscultation:  Regular rate, without rubs, murmurs or gallops  Edema/varicosities:  Not grossly evident Abdominal  Soft,nontender, without masses, guarding or rebound.  Liver/spleen:  No organomegaly noted  Hernia:  None appreciated  Skin  Inspection:  Grossly normal Breasts: Examined lying and sitting.   Right: Without masses, retractions, nipple discharge or axillary adenopathy.   Left: Without masses, retractions, nipple discharge or axillary adenopathy. Genitourinary    Inguinal/mons:  Normal without inguinal adenopathy  External genitalia:  Normal appearing vulva with no masses, tenderness, or lesions  BUS/Urethra/Skene's glands:  Normal  Vagina:  Normal appearing with normal color and discharge, no lesions  Cervix:  Absent  Uterus:  Absent  Adnexa/parametria:     Rt: Normal in size, without masses or tenderness.   Lt: Normal in size, without masses or tenderness.  Anus and perineum: Normal  Digital rectal exam: Deferred  Patient informed chaperone available to be present for breast and pelvic exam. Patient has requested no chaperone to be present. Patient has been advised what will be completed during breast and pelvic exam.   Assessment/Plan:  52 y.o. [id number] for annual exam.   Well female exam with routine gynecological exam - Plan: Cytology - PAP( [facility]). Education provided on SBEs, importance of preventative screenings, current guidelines, high calcium diet, regular exercise, and multivitamin daily.  Labs with PCP.   Anxiety and depression - Plan: sertraline (ZOLOFT) 25 MG tablet daily.  Tried to wean recently but restarted on lower dose of 25 mg. Doing well on this. Refills provided.   Screening for cervical cancer - H/O CIN 3, hysterectomy 2011. Normal paps since. Pap today.   Screening for breast cancer - Normal mammogram history.  Continue annual screenings.  Normal breast exam today.  Screening for colon cancer - Normal colonoscopy 3-4 years agp. Will repeat at 10-year interval per GI's recommendation.   Return in 1 year for annual.     Tamela Gammon DNP, 4:30 PM 08/04/2022

## 2022-08-06 LAB — CYTOLOGY - PAP
Comment: NEGATIVE
Diagnosis: NEGATIVE
High risk HPV: NEGATIVE

## 2022-10-21 ENCOUNTER — Other Ambulatory Visit: Payer: Self-pay | Admitting: Nurse Practitioner

## 2022-10-21 DIAGNOSIS — Z1231 Encounter for screening mammogram for malignant neoplasm of breast: Secondary | ICD-10-CM

## 2022-11-20 ENCOUNTER — Ambulatory Visit
Admission: RE | Admit: 2022-11-20 | Discharge: 2022-11-20 | Disposition: A | Discharge status: Home / Self Care | Payer: PRIVATE HEALTH INSURANCE | Source: Ambulatory Visit | Attending: Nurse Practitioner | Admitting: Nurse Practitioner

## 2022-11-20 DIAGNOSIS — Z1231 Encounter for screening mammogram for malignant neoplasm of breast: Secondary | ICD-10-CM

## 2023-08-01 ENCOUNTER — Other Ambulatory Visit: Payer: Self-pay | Admitting: Nurse Practitioner

## 2023-08-01 DIAGNOSIS — F419 Anxiety disorder, unspecified: Secondary | ICD-10-CM

## 2023-08-03 NOTE — Telephone Encounter (Signed)
 Jennye Moccasin, CMA Left message for patient to call and schedule appointment.

## 2023-08-03 NOTE — Telephone Encounter (Signed)
Med refill request: zoloft Last AEX: 08/04/2022-TW Next AEX: nothing, recall sent per EMR. Msg sent to appt desk to contact the pt to schedule. Last MMG (if hormonal med): n/a Refill authorized: rx pend.

## 2023-08-07 NOTE — Telephone Encounter (Signed)
FYI. Received on 08/06/2023. Angelyn Punt D, CMA Left message to call and schedule; patient has not called back.

## 2023-08-20 ENCOUNTER — Ambulatory Visit (INDEPENDENT_AMBULATORY_CARE_PROVIDER_SITE_OTHER): Payer: PRIVATE HEALTH INSURANCE | Admitting: Nurse Practitioner

## 2023-08-20 ENCOUNTER — Encounter: Payer: Self-pay | Admitting: Nurse Practitioner

## 2023-08-20 VITALS — BP 102/70 | HR 84 | Ht 67.25 in | Wt 174.0 lb

## 2023-08-20 DIAGNOSIS — Z8262 Family history of osteoporosis: Secondary | ICD-10-CM

## 2023-08-20 DIAGNOSIS — Z780 Asymptomatic menopausal state: Secondary | ICD-10-CM | POA: Diagnosis not present

## 2023-08-20 DIAGNOSIS — F32A Depression, unspecified: Secondary | ICD-10-CM

## 2023-08-20 DIAGNOSIS — F419 Anxiety disorder, unspecified: Secondary | ICD-10-CM

## 2023-08-20 DIAGNOSIS — Z1331 Encounter for screening for depression: Secondary | ICD-10-CM | POA: Diagnosis not present

## 2023-08-20 DIAGNOSIS — Z01419 Encounter for gynecological examination (general) (routine) without abnormal findings: Secondary | ICD-10-CM

## 2023-08-20 MED ORDER — SERTRALINE HCL 50 MG PO TABS: 50.0000 mg | ORAL_TABLET | Freq: Every day | ORAL | 0 refills | Status: DC

## 2023-08-20 NOTE — Progress Notes (Signed)
 Tracy Weber 1970-08-12 [id number]   History:  53 y.o. G2P2002 presents for annual exam. S/P 2011 hysterectomy for CIN-3. Experiencing mood changes, mostly feels more emotional, cries easily. H/O anxiety and depression, decreased Zoloft last year to 61 with hopes to wean but feels now is not the best time. Does feel overwhelmed with life - work, youngest going to college, oldest living with her again.  Denies hot flashes, night sweats. Wants to discuss having bone density. Mother has osteoporosis.    Gynecologic History Patient's last menstrual period was 01/28/2010.   Contraception/Family planning: status post hysterectomy Sexually active: Yes  Health Maintenance Last Pap: 08/04/2022. Results were: Normal neg HPV, 3-year repeat Last mammogram: 11/20/2022. Results were: Normal Last colonoscopy: 4-5 years ago. Results were: Normal per patient Last Dexa: Not indicated  Flowsheet Row Office Visit from 08/20/2023 in [facility]  PHQ-2 Total Score 1       Past medical history, past surgical history, family history and social history were all reviewed and documented in the EPIC chart. Divorced. Works at [employer] in [employer]. 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  58 yo son, senior, planning to go to [place] tech in the fall.   ROS:  A ROS was performed and pertinent positives and negatives are included.  Exam:  Vitals:   08/20/23 1026  BP: 102/70  Pulse: 84  SpO2: 98%  Weight: 174 lb (78.9 kg)  Height: 5' 7.25" (1.708 m)     Body mass index is 27.05 kg/m.  General appearance:  Normal Thyroid:  Symmetrical, normal in size, without palpable masses or nodularity. Respiratory  Auscultation:  Clear without wheezing or  rhonchi Cardiovascular  Auscultation:  Regular rate, without rubs, murmurs or gallops  Edema/varicosities:  Not grossly evident Abdominal  Soft,nontender, without masses, guarding or rebound.  Liver/spleen:  No organomegaly noted  Hernia:  None appreciated  Skin  Inspection:  Grossly normal Breasts: Examined lying and sitting.   Right: Without masses, retractions, nipple discharge or axillary adenopathy.   Left: Without masses, retractions, nipple discharge or axillary adenopathy. Pelvic: External genitalia:  no lesions              Urethra:  normal appearing urethra with no masses, tenderness or lesions              Bartholins and Skenes: normal                 Vagina: normal appearing vagina with normal color and discharge, no lesions              Cervix: absent Bimanual Exam:  Uterus:  absent              Adnexa: no mass, fullness, tenderness              Rectovaginal: Deferred              Anus:  normal, no lesions  Patient informed chaperone available to be present for breast and pelvic exam. Patient has requested no chaperone to be present. Patient has been advised what will be completed during breast and pelvic exam.   Assessment/Plan:  53 y.o. G2P2002 for annual exam.   Well female exam with routine gynecological exam - Education provided on SBEs, importance of preventative screenings, current guidelines, high calcium diet, regular exercise, and multivitamin daily.  Labs with PCP.   Anxiety and depression - Plan: sertraline (  ZOLOFT) 50 MG tablet daily. Will increase dose to see if this helps with mood changes. Mood changes could be related to perimenopause. Not interested in HRT at this time.  Family history of osteoporosis in mother - Plan: DG Bone Density  Postmenopausal - Plan: DG Bone Density  Screening for cervical cancer - H/O CIN 3, hysterectomy 2011. Normal paps since. Will repeat at 3-year interval per guidelines.   Screening for breast cancer - Normal mammogram  history.  Continue annual screenings.  Normal breast exam today.  Screening for colon cancer - Normal colonoscopy 4-5 years agp. Will repeat at 10-year interval per GI's recommendation.   Return in about 1 year (around 08/19/2024) for Annual.    Olivia Mackie DNP, 10:50 AM 08/20/2023

## 2023-11-05 ENCOUNTER — Other Ambulatory Visit: Payer: Self-pay | Admitting: Nurse Practitioner

## 2023-11-05 DIAGNOSIS — Z1231 Encounter for screening mammogram for malignant neoplasm of breast: Secondary | ICD-10-CM

## 2023-11-23 ENCOUNTER — Ambulatory Visit: Payer: PRIVATE HEALTH INSURANCE

## 2023-11-23 ENCOUNTER — Other Ambulatory Visit: Payer: Self-pay | Admitting: Nurse Practitioner

## 2023-11-23 DIAGNOSIS — F419 Anxiety disorder, unspecified: Secondary | ICD-10-CM

## 2023-11-23 NOTE — Telephone Encounter (Signed)
 Medication refill request: zoloft  50mg  Last AEX:  08-20-23 Next AEX: 08-24-24 Last MMG (if hormonal medication request): n/a Refill authorized: please approve if appropriate

## 2023-12-14 ENCOUNTER — Ambulatory Visit: Payer: PRIVATE HEALTH INSURANCE

## 2023-12-28 ENCOUNTER — Ambulatory Visit: Payer: PRIVATE HEALTH INSURANCE

## 2024-01-01 ENCOUNTER — Other Ambulatory Visit: Payer: Self-pay | Admitting: Medical Genetics

## 2024-01-06 ENCOUNTER — Ambulatory Visit
Admission: RE | Admit: 2024-01-06 | Discharge: 2024-01-06 | Disposition: A | Discharge status: Home / Self Care | Payer: PRIVATE HEALTH INSURANCE | Source: Ambulatory Visit | Attending: Nurse Practitioner

## 2024-01-06 DIAGNOSIS — Z1231 Encounter for screening mammogram for malignant neoplasm of breast: Secondary | ICD-10-CM

## 2024-01-12 ENCOUNTER — Other Ambulatory Visit: Payer: Self-pay

## 2024-01-13 ENCOUNTER — Other Ambulatory Visit: Payer: PRIVATE HEALTH INSURANCE

## 2024-04-11 ENCOUNTER — Other Ambulatory Visit: Payer: Self-pay | Admitting: Medical Genetics

## 2024-04-11 DIAGNOSIS — Z006 Encounter for examination for normal comparison and control in clinical research program: Secondary | ICD-10-CM

## 2024-05-30 LAB — GENECONNECT MOLECULAR SCREEN: Genetic Analysis Overall Interpretation: NEGATIVE

## 2024-08-24 ENCOUNTER — Ambulatory Visit: Payer: PRIVATE HEALTH INSURANCE | Admitting: Nurse Practitioner
